# Patient Record
Sex: Male | Born: 1965 | Race: White | Hispanic: No | Marital: Married | Smoking: Never smoker
Health system: Southern US, Community
[De-identification: ages and names within clinical notes are randomized; demographics above are authoritative.]

## PROBLEM LIST (undated history)

## (undated) HISTORY — PX: BACK SURGERY: SHX140

## (undated) HISTORY — PX: NECK SURGERY: SHX720

---

## 1997-09-11 ENCOUNTER — Ambulatory Visit (HOSPITAL_COMMUNITY): Admission: RE | Admit: 1997-09-11 | Discharge: 1997-09-11 | Payer: Self-pay | Admitting: Internal Medicine

## 2017-07-17 ENCOUNTER — Emergency Department (HOSPITAL_BASED_OUTPATIENT_CLINIC_OR_DEPARTMENT_OTHER): Payer: 59

## 2017-07-17 ENCOUNTER — Other Ambulatory Visit: Payer: Self-pay

## 2017-07-17 ENCOUNTER — Emergency Department (HOSPITAL_BASED_OUTPATIENT_CLINIC_OR_DEPARTMENT_OTHER)
Admission: EM | Admit: 2017-07-17 | Discharge: 2017-07-17 | Disposition: A | Discharge status: Home / Self Care | Payer: 59 | Attending: Emergency Medicine | Admitting: Emergency Medicine

## 2017-07-17 ENCOUNTER — Encounter (HOSPITAL_BASED_OUTPATIENT_CLINIC_OR_DEPARTMENT_OTHER): Payer: Self-pay | Admitting: Emergency Medicine

## 2017-07-17 DIAGNOSIS — R079 Chest pain, unspecified: Secondary | ICD-10-CM | POA: Diagnosis present

## 2017-07-17 LAB — BASIC METABOLIC PANEL
Anion gap: 10 (ref 5–15)
BUN: 17 mg/dL (ref 6–20)
CALCIUM: 9.3 mg/dL (ref 8.9–10.3)
CHLORIDE: 101 mmol/L (ref 101–111)
CO2: 26 mmol/L (ref 22–32)
CREATININE: 0.92 mg/dL (ref 0.61–1.24)
GFR calc non Af Amer: >60 mL/min (ref >60)
Glucose, Bld: 91 mg/dL (ref 65–99)
Potassium: 3.9 mmol/L (ref 3.5–5.1)
SODIUM: 137 mmol/L (ref 135–145)

## 2017-07-17 LAB — CBC
HCT: 44.2 % (ref 39.0–52.0)
Hemoglobin: 15.3 g/dL (ref 13.0–17.0)
MCH: 31.7 pg (ref 26.0–34.0)
MCHC: 34.6 g/dL (ref 30.0–36.0)
MCV: 91.5 fL (ref 78.0–100.0)
Platelets: 186 10*3/uL (ref 150–400)
RBC: 4.83 MIL/uL (ref 4.22–5.81)
RDW: 12.4 % (ref 11.5–15.5)
WBC: 13.7 10*3/uL — AB (ref 4.0–10.5)

## 2017-07-17 LAB — D-DIMER, QUANTITATIVE: D-Dimer, Quant: 0.59 ug/mL-FEU — ABNORMAL HIGH (ref 0.00–0.50)

## 2017-07-17 LAB — TROPONIN I: Troponin I: <0.03 ng/mL (ref <0.03)

## 2017-07-17 MED ORDER — IOPAMIDOL (ISOVUE-370) INJECTION 76%
100.0000 mL | Freq: Once | INTRAVENOUS | Status: AC | PRN
  Administered 2017-07-17: 100 mL via INTRAVENOUS

## 2017-07-17 MED ORDER — SODIUM CHLORIDE 0.9 % IV BOLUS
1000.0000 mL | Freq: Once | INTRAVENOUS | Status: AC
  Administered 2017-07-17: 1000 mL via INTRAVENOUS

## 2017-07-17 MED ORDER — GI COCKTAIL ~~LOC~~
30.0000 mL | Freq: Once | ORAL | Status: AC
  Administered 2017-07-17: 30 mL via ORAL
  Filled 2017-07-17: qty 30

## 2017-07-17 MED ORDER — NITROGLYCERIN 0.4 MG SL SUBL
0.4000 mg | SUBLINGUAL_TABLET | SUBLINGUAL | Status: DC | PRN
Start: 2017-07-17 — End: 2017-07-17
  Administered 2017-07-17 (×2): 0.4 mg via SUBLINGUAL
  Filled 2017-07-17 (×2): qty 1

## 2017-07-17 MED ORDER — ASPIRIN 81 MG PO CHEW
324.0000 mg | CHEWABLE_TABLET | Freq: Once | ORAL | Status: AC
  Administered 2017-07-17: 324 mg via ORAL
  Filled 2017-07-17: qty 4

## 2017-07-17 NOTE — ED Triage Notes (Signed)
Pt states he was woken up at 4 am with chest tightness and neck pain. It hurts worse to take a deep breath.

## 2017-07-17 NOTE — Discharge Instructions (Addendum)
If your chest pain worsens or does not improve in the next 24-48 hours or if you develop worsening shortness of breath, abdominal or back pain, vomiting, sweating, or any other new/concerning symptoms return to the ER for evaluation.  Otherwise follow-up with your doctor this week.

## 2017-07-17 NOTE — ED Provider Notes (Signed)
[facility] EMERGENCY DEPARTMENT Provider Note   CSN: [id number] Arrival date & time: 07/17/17  1021     History   Chief Complaint Chief Complaint  Patient presents with  . Chest Pain    HPI Brent Hoffman is a 52 y.o. male.  HPI  52 year old male with history of chronic back and neck problems but no other significant medical history presents with chest tightness and back pain.  He states around 4:00 he woke up with the symptoms.  He is not sure if it woke him up but ever since then he is been having about a 7/10 tightness in the chest.  The only thing that worsens it is inspiration.  He does not feel short of breath otherwise.  No sweating or vomiting.  No jaw pain.  The pain does not radiate straight through to his back but he also has concomitant back pain in between his scapula.  No leg swelling or leg pain.  No recent surgeries.  About 3 or 4 weeks ago he got back from [place].  Tried Excedrin for the pain without relief. Did not notice worsening pain with walking.  History reviewed. No pertinent past medical history.  There are no active problems to display for this patient.   Past Surgical History:  Procedure Laterality Date  . BACK SURGERY    . NECK SURGERY          Home Medications    Prior to Admission medications   Not on File    Family History No family history on file.  Social History Social History   Tobacco Use  . Smoking status: Never Smoker  . Smokeless tobacco: Never Used  Substance Use Topics  . Alcohol use: Yes  . Drug use: Never     Allergies   Patient has no known allergies.   Review of Systems Review of Systems  Constitutional: Negative for diaphoresis.  Respiratory: Positive for chest tightness. Negative for shortness of breath.   Cardiovascular: Positive for chest pain. Negative for leg swelling.  Gastrointestinal: Negative for abdominal pain and vomiting.  Musculoskeletal: Positive for back pain.  All  other systems reviewed and are negative.    Physical Exam Updated Vital Signs BP 116/77   Pulse 86   Temp 98.4 F (36.9 C) (Oral)   Resp 20   Ht 5\' 9"  (1.753 m)   Wt 73.5 kg (162 lb)   SpO2 100%   BMI 23.92 kg/m   Physical Exam  Constitutional: He is oriented to person, place, and time. He appears well-developed and well-nourished.  Non-toxic appearance. He does not appear ill. No distress.  HENT:  Head: Normocephalic and atraumatic.  Right Ear: External ear normal.  Left Ear: External ear normal.  Nose: Nose normal.  Eyes: Right eye exhibits no discharge. Left eye exhibits no discharge.  Neck: Neck supple.  Cardiovascular: Normal rate, regular rhythm and normal heart sounds.  Pulmonary/Chest: Effort normal and breath sounds normal. He exhibits no tenderness.  Abdominal: Soft. There is no tenderness.  Musculoskeletal: He exhibits no edema.  No thoracic back tenderness  Neurological: He is alert and oriented to person, place, and time.  Skin: Skin is warm and dry. He is not diaphoretic.  Nursing note and vitals reviewed.    ED Treatments / Results  Labs (all labs ordered are listed, but only abnormal results are displayed) Labs Reviewed  CBC - Abnormal; Notable for the following components:      Result Value  WBC 13.7 (*)    All other components within normal limits  D-DIMER, QUANTITATIVE ([facility]) - Abnormal; Notable for the following components:   D-Dimer, Quant 0.59 (*)    All other components within normal limits  BASIC METABOLIC PANEL  TROPONIN I  TROPONIN I    EKG EKG Interpretation  Date/Time:  Sunday July 17 2017 10:26:22 EDT Ventricular Rate:  99 PR Interval:    QRS Duration: 94 QT Interval:  355 QTC Calculation: 456 R Axis:   72 Text Interpretation:  Normal sinus rhythm no acute ST/T changes No old tracing to compare Confirmed by Pricilla LovelessGoldston, Dahmir Epperly [phone]([id number]) on 07/17/2017 10:30:38 AM  EKG Interpretation  Date/Time:  Sunday July 17 2017  12:10:53 EDT Ventricular Rate:  89 PR Interval:    QRS Duration: 98 QT Interval:  368 QTC Calculation: 448 R Axis:   58 Text Interpretation:  Sinus rhythm no acute ST/T changes no significant change since earlier in the day Confirmed by Pricilla LovelessGoldston, Vidur Knust [phone]([id number]) on 07/17/2017 12:22:40 PM   Radiology Dg Chest 2 View  Result Date: 07/17/2017 CLINICAL DATA:  52 year old male with history of chest pain since 4 a.m. Unable to take a deep breath. EXAM: CHEST - 2 VIEW COMPARISON:  No priors. FINDINGS: Lung volumes are normal. No consolidative airspace disease. No pleural effusions. No pneumothorax. No pulmonary nodule or mass noted. Pulmonary vasculature and the cardiomediastinal silhouette are within normal limits. IMPRESSION: No radiographic evidence of acute cardiopulmonary disease. Electronically Signed   By: Trudie Reedaniel  Entrikin M.D.   On: 07/17/2017 11:28   Ct Angio Chest Pe W/cm &/or Wo Cm  Result Date: 07/17/2017 CLINICAL DATA:  Chest pain since this morning with pleuritic component. EXAM: CT ANGIOGRAPHY CHEST WITH CONTRAST TECHNIQUE: Multidetector CT imaging of the chest was performed using the standard protocol during bolus administration of intravenous contrast. Multiplanar CT image reconstructions and MIPs were obtained to evaluate the vascular anatomy. CONTRAST:  100mL ISOVUE-370 IOPAMIDOL (ISOVUE-370) INJECTION 76% COMPARISON:  None. FINDINGS: Cardiovascular: Heart is normal size. Thoracic aorta is within normal. Pulmonary arterial system is normal without emboli. Mediastinum/Nodes: No mediastinal or hilar adenopathy. Remaining mediastinal structures are normal. Lungs/Pleura: Lungs are well inflated without focal airspace consolidation or effusion. There are a few scattered tiny subcentimeter calcified granulomas. Airways are normal. Upper Abdomen: No acute abnormality. Musculoskeletal: Degenerative changes of the spine. Review of the MIP images confirms the above findings. IMPRESSION: No evidence  of pulmonary embolism. No acute cardiopulmonary disease. Electronically Signed   By: Elberta Fortisaniel  Boyle M.D.   On: 07/17/2017 13:15    Procedures Procedures (including critical care time)  Medications Ordered in ED Medications  nitroGLYCERIN (NITROSTAT) SL tablet 0.4 mg (0.4 mg Sublingual Given 07/17/17 1146)  aspirin chewable tablet 324 mg (324 mg Oral Given 07/17/17 1100)  gi cocktail (Maalox,Lidocaine,Donnatal) (30 mLs Oral Given 07/17/17 1200)  sodium chloride 0.9 % bolus 1,000 mL (0 mLs Intravenous Stopped 07/17/17 1359)  iopamidol (ISOVUE-370) 76 % injection 100 mL (100 mLs Intravenous Contrast Given 07/17/17 1226)     Initial Impression / Assessment and Plan / ED Course  I have reviewed the triage vital signs and the nursing notes.  Pertinent labs & imaging results that were available during my care of the patient were reviewed by me and considered in my medical decision making (see chart for details).     Patient's pain is quite atypical.  He was given nitroglycerin and aspirin but did not have any significant change.  He was then given  GI cocktail with partial improvement.  I suspect this is not cardiac given no ECG changes, 2 negative troponins and an atypical history and presentation.  He did recently have travel and thus a d-dimer sent.  This is slightly positive.  Thus a CT obtained and shows no acute pathology.  With all of this, I think he is stable for discharge home to follow-up with his PCP.  We discussed that while MI has been ruled out, he still could have cardiac disease and needs close outpatient follow-up with PCP.  Discussed return precautions.  Final Clinical Impressions(s) / ED Diagnoses   Final diagnoses:  Nonspecific chest pain    ED Discharge Orders    None       Pricilla Loveless, MD 07/17/17 [phone]

## 2019-06-23 IMAGING — CT CT ANGIO CHEST
2 of 8 series · 19 of 36 positions shown · IV contrast (iopamidol)
Comparison: None.

CLINICAL DATA: Chest pain since this morning with pleuritic
component.

EXAM:
CT ANGIOGRAPHY CHEST WITH CONTRAST
TECHNIQUE: Multidetector CT imaging of the chest was performed using the
standard protocol during bolus administration of intravenous
contrast. Multiplanar CT image reconstructions and MIPs were
obtained to evaluate the vascular anatomy.
CONTRAST:  100mL [id number] IOPAMIDOL ([id number]) INJECTION 76%

[Series 6: pe thins · axial · 0.71mm/px · z∈[-302,+4]mm · 18 of 343 slices shown]
[im 19/343  lung]
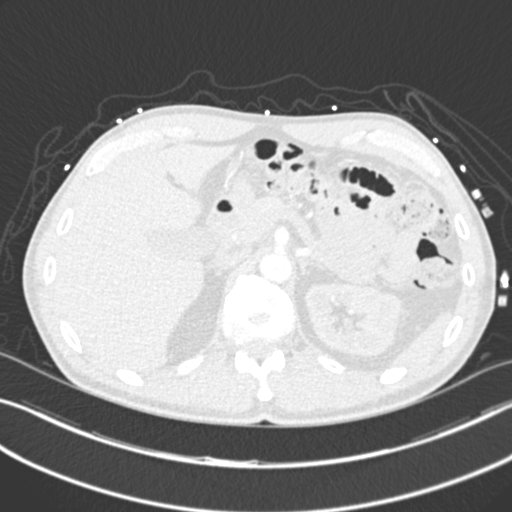
[im 37/343  mediastinal]
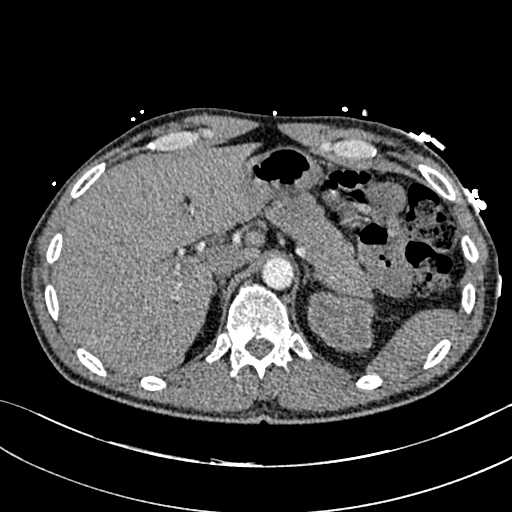
[im 55/343  lung]
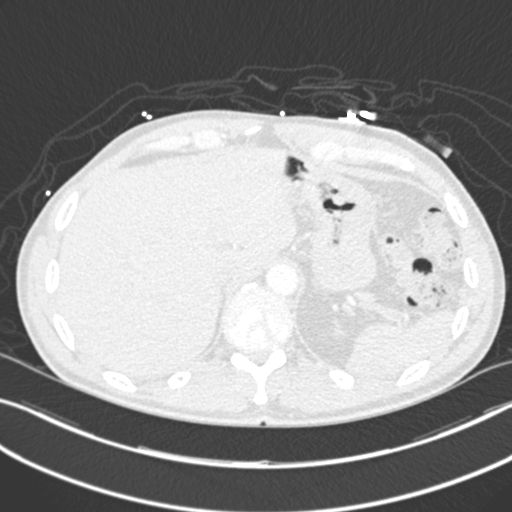
[im 73/343  mediastinal]
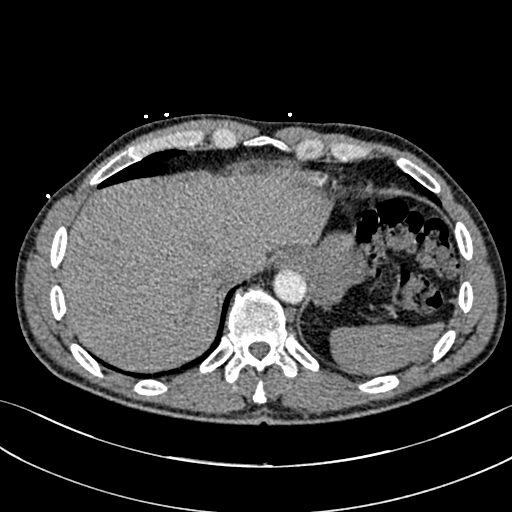
[im 91/343  lung]
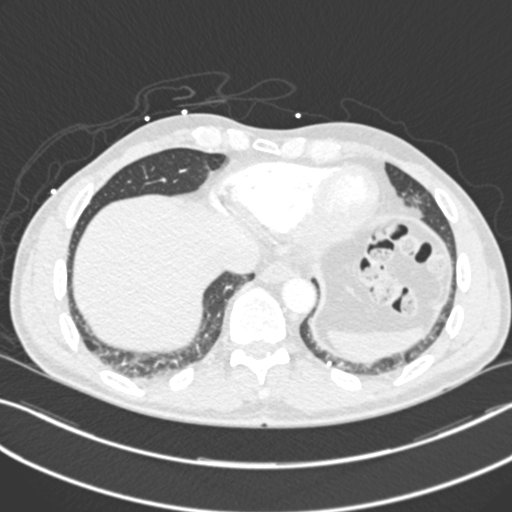
[im 109/343  mediastinal]
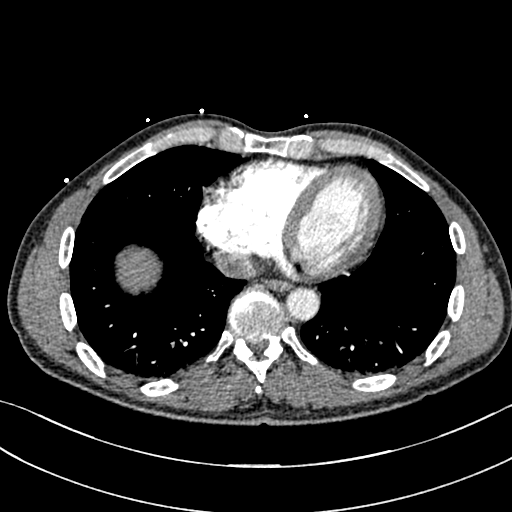
[im 127/343  lung]
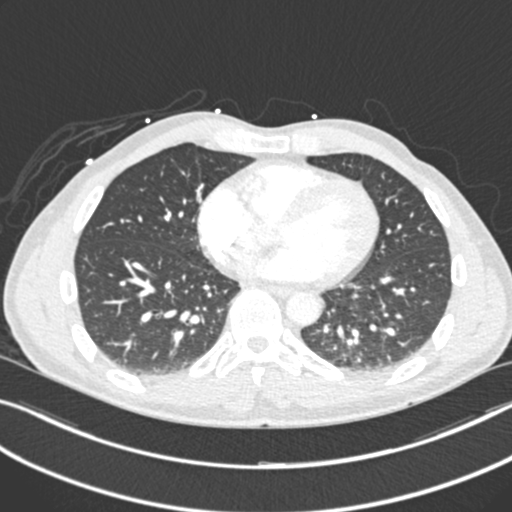
[im 145/343  mediastinal]
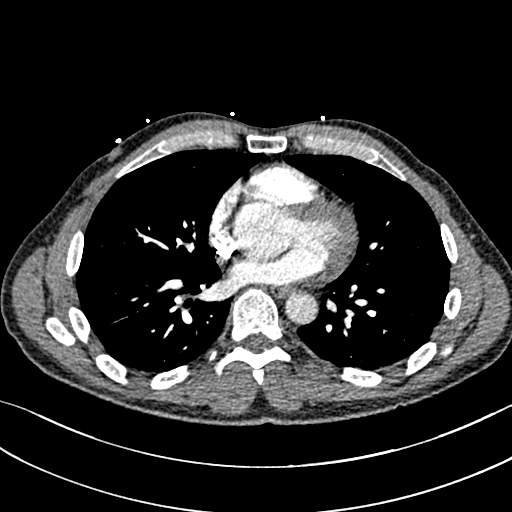
[im 163/343  lung]
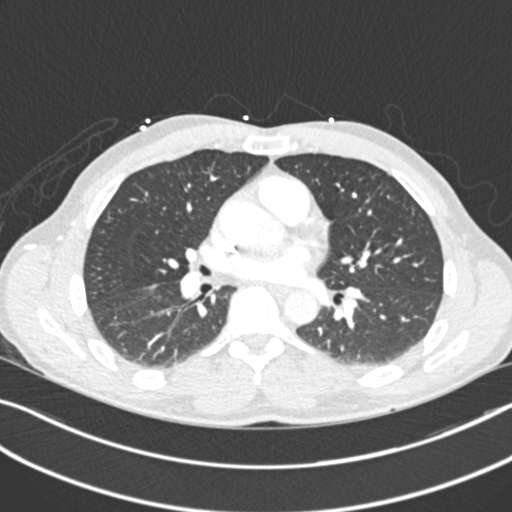
[im 181/343  mediastinal]
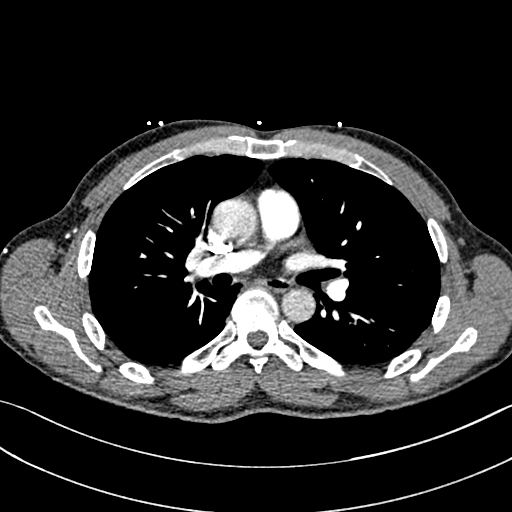
[im 199/343  lung]
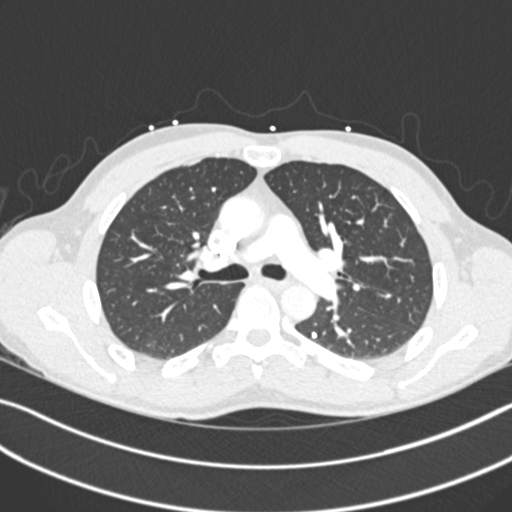
[im 217/343  mediastinal]
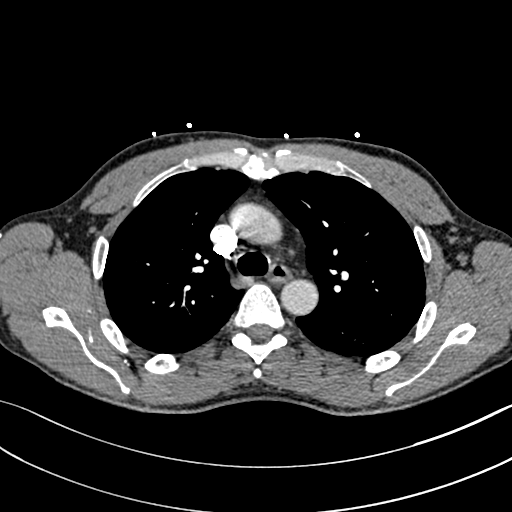
[im 235/343  lung]
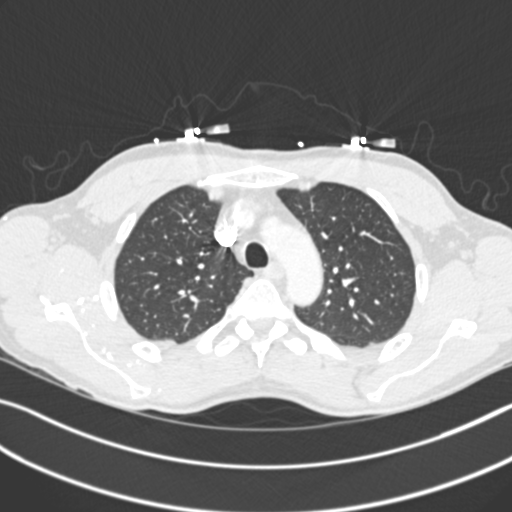
[im 253/343  mediastinal]
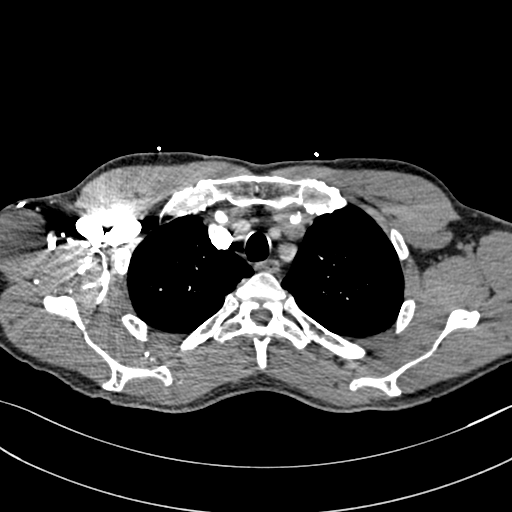
[im 271/343  lung]
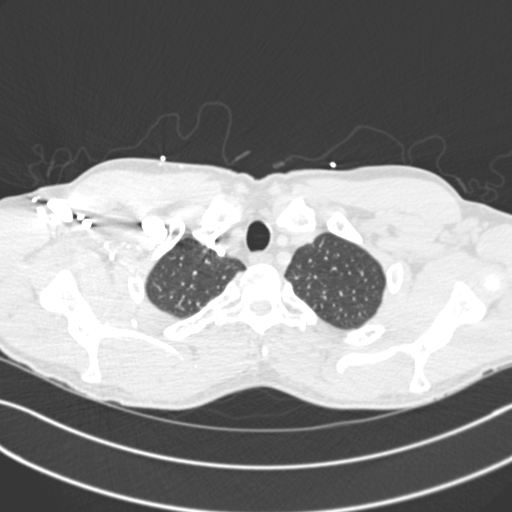
[im 289/343  mediastinal]
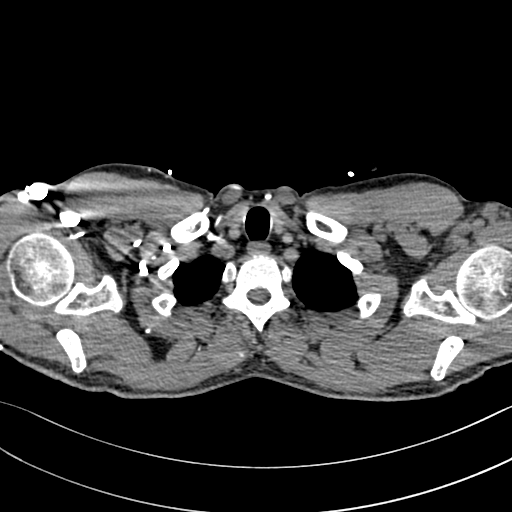
[im 307/343  lung]
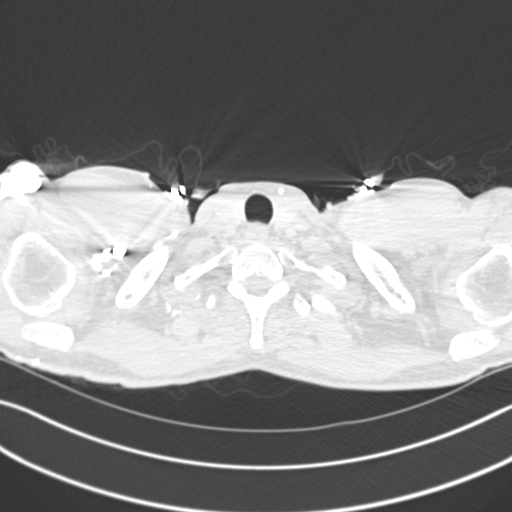
[im 325/343  mediastinal]
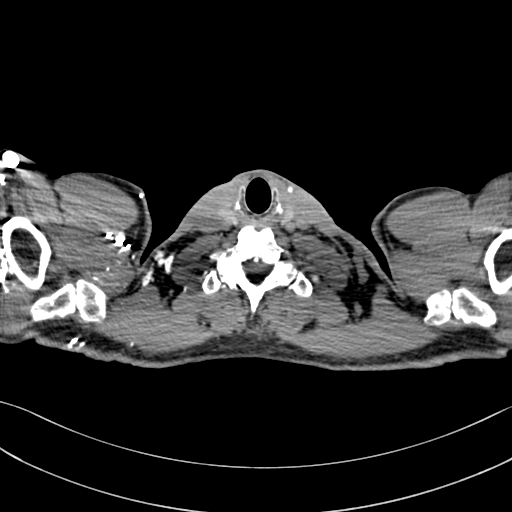

[Series 7: pe coronal mpr · coronal · 0.67mm/px · 1 of 149 slices shown]
[im 75/149  mediastinal]
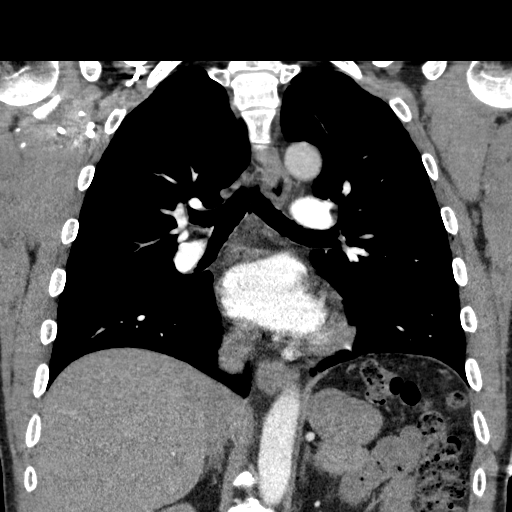

[19 of 36 positions shown; findings below may reference images not displayed]

FINDINGS: Cardiovascular: Heart is normal size. Thoracic aorta is within
normal. Pulmonary arterial system is normal without emboli.

Mediastinum/Nodes: No mediastinal or hilar adenopathy. Remaining
mediastinal structures are normal.

Lungs/Pleura: Lungs are well inflated without focal airspace
consolidation or effusion. There are a few scattered tiny
subcentimeter calcified granulomas. Airways are normal.

Upper Abdomen: No acute abnormality.

Musculoskeletal: Degenerative changes of the spine.

Review of the MIP images confirms the above findings.
IMPRESSION: No evidence of pulmonary embolism. No acute cardiopulmonary disease.
